# Patient Record
Sex: Female | Born: 1965 | Race: Black or African American | Hispanic: No | Marital: Single | Smoking: Current some day smoker
Health system: Southern US, Community
[De-identification: ages and names within clinical notes are randomized; demographics above are authoritative.]

## PROBLEM LIST (undated history)

## (undated) DIAGNOSIS — C801 Malignant (primary) neoplasm, unspecified: Secondary | ICD-10-CM

## (undated) DIAGNOSIS — E7800 Pure hypercholesterolemia, unspecified: Secondary | ICD-10-CM

## (undated) DIAGNOSIS — E079 Disorder of thyroid, unspecified: Secondary | ICD-10-CM

## (undated) DIAGNOSIS — I10 Essential (primary) hypertension: Secondary | ICD-10-CM

## (undated) DIAGNOSIS — E559 Vitamin D deficiency, unspecified: Secondary | ICD-10-CM

## (undated) DIAGNOSIS — M1990 Unspecified osteoarthritis, unspecified site: Secondary | ICD-10-CM

## (undated) DIAGNOSIS — F32A Depression, unspecified: Secondary | ICD-10-CM

## (undated) DIAGNOSIS — F329 Major depressive disorder, single episode, unspecified: Secondary | ICD-10-CM

## (undated) DIAGNOSIS — M5430 Sciatica, unspecified side: Secondary | ICD-10-CM

## (undated) HISTORY — PX: BREAST SURGERY: SHX581

## (undated) HISTORY — PX: MASTECTOMY: SHX3

## (undated) HISTORY — PX: GASTRIC BYPASS: SHX52

## (undated) HISTORY — PX: CHOLECYSTECTOMY: SHX55

## (undated) HISTORY — PX: ABDOMINAL HYSTERECTOMY: SHX81

---

## 2017-03-09 ENCOUNTER — Other Ambulatory Visit: Payer: Self-pay | Admitting: Internal Medicine

## 2017-03-09 DIAGNOSIS — R1900 Intra-abdominal and pelvic swelling, mass and lump, unspecified site: Secondary | ICD-10-CM

## 2017-05-13 ENCOUNTER — Emergency Department (HOSPITAL_COMMUNITY)
Admission: EM | Admit: 2017-05-13 | Discharge: 2017-05-13 | Disposition: A | Discharge status: Home / Self Care | Payer: Medicaid Other | Attending: Emergency Medicine | Admitting: Emergency Medicine

## 2017-05-13 ENCOUNTER — Encounter (HOSPITAL_COMMUNITY): Payer: Self-pay | Admitting: Emergency Medicine

## 2017-05-13 DIAGNOSIS — I10 Essential (primary) hypertension: Secondary | ICD-10-CM | POA: Insufficient documentation

## 2017-05-13 DIAGNOSIS — M5432 Sciatica, left side: Secondary | ICD-10-CM

## 2017-05-13 DIAGNOSIS — E079 Disorder of thyroid, unspecified: Secondary | ICD-10-CM | POA: Diagnosis not present

## 2017-05-13 DIAGNOSIS — F17210 Nicotine dependence, cigarettes, uncomplicated: Secondary | ICD-10-CM | POA: Diagnosis not present

## 2017-05-13 DIAGNOSIS — M545 Low back pain: Secondary | ICD-10-CM | POA: Diagnosis present

## 2017-05-13 HISTORY — DX: Disorder of thyroid, unspecified: E07.9

## 2017-05-13 HISTORY — DX: Unspecified osteoarthritis, unspecified site: M19.90

## 2017-05-13 HISTORY — DX: Vitamin D deficiency, unspecified: E55.9

## 2017-05-13 HISTORY — DX: Major depressive disorder, single episode, unspecified: F32.9

## 2017-05-13 HISTORY — DX: Malignant (primary) neoplasm, unspecified: C80.1

## 2017-05-13 HISTORY — DX: Sciatica, unspecified side: M54.30

## 2017-05-13 HISTORY — DX: Essential (primary) hypertension: I10

## 2017-05-13 HISTORY — DX: Depression, unspecified: F32.A

## 2017-05-13 HISTORY — DX: Pure hypercholesterolemia, unspecified: E78.00

## 2017-05-13 MED ORDER — PREDNISONE 20 MG PO TABS: 40.0000 mg | ORAL_TABLET | Freq: Every day | ORAL | 0 refills | Status: AC

## 2017-05-13 MED ORDER — DEXAMETHASONE SODIUM PHOSPHATE 10 MG/ML IJ SOLN
10.0000 mg | Freq: Once | INTRAMUSCULAR | Status: AC
  Administered 2017-05-13: 10 mg via INTRAMUSCULAR
  Filled 2017-05-13: qty 1

## 2017-05-13 MED ORDER — HYDROCODONE-ACETAMINOPHEN 5-325 MG PO TABS
2.0000 | ORAL_TABLET | Freq: Once | ORAL | Status: AC
  Administered 2017-05-13: 2 via ORAL
  Filled 2017-05-13: qty 2

## 2017-05-13 MED ORDER — CYCLOBENZAPRINE HCL 10 MG PO TABS: 10.0000 mg | ORAL_TABLET | Freq: Two times a day (BID) | ORAL | 0 refills | Status: DC | PRN

## 2017-05-13 NOTE — ED Triage Notes (Signed)
Pt c/o L lower back pain radiating down L thigh.

## 2017-05-13 NOTE — ED Provider Notes (Signed)
[facility] EMERGENCY DEPARTMENT Provider Note   CSN: [id number] Arrival date & time: 05/13/17  4431     History   Chief Complaint Chief Complaint  Patient presents with  . Back Pain    HPI Melanie Sharp is a 52 y.o. female.  Patient presents to the emergency department with a chief complaint of left-sided low back pain.  She states that the pain started a couple of days ago.  She reports that the pain comes and goes.  She states that it worsened this evening.  She complains of pain radiating down her left leg.  She denies any fevers, or chills.  She denies any bowel or bladder incontinence.  She has tried taking OTC meds with no relief.  She reports a history of sciatica, and states that this feels the same.   The history is provided by the patient. No language interpreter was used.    Past Medical History:  Diagnosis Date  . Arthritis   . Cancer ([place])   . Depression   . Hypercholesteremia   . Hypertension   . Sciatica   . Thyroid disease   . Vitamin D deficiency     There are no active problems to display for this patient.   Past Surgical History:  Procedure Laterality Date  . ABDOMINAL HYSTERECTOMY    . BREAST SURGERY Bilateral   . CHOLECYSTECTOMY    . GASTRIC BYPASS    . MASTECTOMY      OB History    No data available       Home Medications    Prior to Admission medications   Medication Sig Start Date End Date Taking? Authorizing Provider  cyclobenzaprine (FLEXERIL) 10 MG tablet Take 1 tablet (10 mg total) by mouth 2 (two) times daily as needed for muscle spasms. 05/13/17   Montine Circle, PA-C  predniSONE (DELTASONE) 20 MG tablet Take 2 tablets (40 mg total) by mouth daily. Take 40 mg by mouth daily for 3 days, then 20mg  by mouth daily for 3 days, then 10mg  daily for 3 days 05/13/17   Montine Circle, PA-C    Family History No family history on file.  Social History Social History   Tobacco Use  . Smoking status: Current Some  Day Smoker  . Smokeless tobacco: Former Network engineer Use Topics  . Alcohol use: Yes  . Drug use: No     Allergies   Patient has no known allergies.   Review of Systems Review of Systems  All other systems reviewed and are negative.    Physical Exam Updated Vital Signs BP (!) 143/105 (BP Location: Right Arm)   Pulse 72   Temp 98.1 F (36.7 C) (Oral)   Resp 18   Ht 5\' 8"  (1.727 m)   Wt 86.6 kg (191 lb)   SpO2 100%   BMI 29.04 kg/m   Physical Exam Physical Exam  Constitutional: Pt appears well-developed and well-nourished. No distress.  HENT:  Head: Normocephalic and atraumatic.  Mouth/Throat: Oropharynx is clear and moist. No oropharyngeal exudate.  Eyes: Conjunctivae are normal.  Neck: Normal range of motion. Neck supple.  No meningismus Cardiovascular: Normal rate, regular rhythm and intact distal pulses.   Pulmonary/Chest: Effort normal and breath sounds normal. No respiratory distress. Pt has no wheezes.  Abdominal: Pt exhibits no distension Musculoskeletal:  No CTLS spine tenderness, deformity, step-off, or crepitus Lymphadenopathy: Pt has no cervical adenopathy.  Neurological: Pt is alert and oriented Speech is clear and goal oriented,  follows commands Normal 5/5 strength in upper and lower extremities bilaterally including dorsiflexion and plantar flexion, strong and equal grip strength Sensation intact Great toe extension intact Moves extremities without ataxia, coordination intact Normal gait Normal balance No Clonus Skin: Skin is warm and dry. No rash noted. Pt is not diaphoretic. No erythema.  Psychiatric: Pt has a normal mood and affect. Behavior is normal.  Nursing note and vitals reviewed.   ED Treatments / Results  Labs (all labs ordered are listed, but only abnormal results are displayed) Labs Reviewed - No data to display  EKG  EKG Interpretation None       Radiology No results found.  Procedures Procedures (including  critical care time)  Medications Ordered in ED Medications  dexamethasone (DECADRON) injection 10 mg (10 mg Intramuscular Given 05/13/17 0614)  HYDROcodone-acetaminophen (NORCO/VICODIN) 5-325 MG per tablet 2 tablet (2 tablets Oral Given 05/13/17 9242)     Initial Impression / Assessment and Plan / ED Course  I have reviewed the triage vital signs and the nursing notes.  Pertinent labs & imaging results that were available during my care of the patient were reviewed by me and considered in my medical decision making (see chart for details).     Patient with sciatica x several days.    No neurological deficits and normal neuro exam.  Patient is ambulatory.  No loss of bowel or bladder control.  Doubt cauda equina.  Denies fever,  doubt epidural abscess or other lesion. Recommend back exercises, stretching, RICE, and will treat with a short course of flexeril and prednisone.  Encouraged the patient that there could be a need for additional workup and/or imaging such as MRI, if the symptoms do not resolve. Patient advised that if the back pain does not resolve, or radiates, this could progress to more serious conditions and is encouraged to follow-up with PCP or orthopedics within 2 weeks.     Final Clinical Impressions(s) / ED Diagnoses   Final diagnoses:  Sciatica of left side    ED Discharge Orders        Ordered    cyclobenzaprine (FLEXERIL) 10 MG tablet  2 times daily PRN     05/13/17 0627    predniSONE (DELTASONE) 20 MG tablet  Daily     05/13/17 0627       Montine Circle, PA-C 05/13/17 6834    Varney Biles, MD 05/14/17 [id number]

## 2017-05-23 ENCOUNTER — Telehealth: Payer: Self-pay | Admitting: Hematology and Oncology

## 2017-05-23 ENCOUNTER — Encounter: Payer: Self-pay | Admitting: Hematology and Oncology

## 2017-05-23 NOTE — Telephone Encounter (Signed)
Appt has been scheduled for the pt to see Dr. Lindi Adie on 3/19 at 345pm. Pt aware to arrive 30 minutes early. Letter mailed.

## 2017-06-11 ENCOUNTER — Ambulatory Visit
Admission: RE | Admit: 2017-06-11 | Discharge: 2017-06-11 | Disposition: A | Discharge status: Home / Self Care | Payer: Medicaid Other | Source: Ambulatory Visit | Attending: Internal Medicine | Admitting: Internal Medicine

## 2017-06-11 DIAGNOSIS — R1900 Intra-abdominal and pelvic swelling, mass and lump, unspecified site: Secondary | ICD-10-CM

## 2017-06-13 ENCOUNTER — Other Ambulatory Visit (HOSPITAL_BASED_OUTPATIENT_CLINIC_OR_DEPARTMENT_OTHER): Payer: Self-pay

## 2017-06-13 ENCOUNTER — Ambulatory Visit
Admission: RE | Admit: 2017-06-13 | Discharge: 2017-06-13 | Disposition: A | Discharge status: Home / Self Care | Payer: Medicaid Other | Source: Ambulatory Visit | Attending: Internal Medicine | Admitting: Internal Medicine

## 2017-06-13 DIAGNOSIS — R0683 Snoring: Secondary | ICD-10-CM

## 2017-06-13 DIAGNOSIS — G4710 Hypersomnia, unspecified: Secondary | ICD-10-CM

## 2017-06-26 ENCOUNTER — Inpatient Hospital Stay: Payer: Medicaid Other | Attending: Hematology and Oncology | Admitting: Hematology and Oncology

## 2017-06-26 ENCOUNTER — Telehealth: Payer: Self-pay | Admitting: Hematology and Oncology

## 2017-06-26 DIAGNOSIS — Z853 Personal history of malignant neoplasm of breast: Secondary | ICD-10-CM | POA: Diagnosis not present

## 2017-06-26 DIAGNOSIS — R222 Localized swelling, mass and lump, trunk: Secondary | ICD-10-CM

## 2017-06-26 DIAGNOSIS — Z8589 Personal history of malignant neoplasm of other organs and systems: Secondary | ICD-10-CM | POA: Diagnosis not present

## 2017-06-26 DIAGNOSIS — Z170 Estrogen receptor positive status [ER+]: Secondary | ICD-10-CM | POA: Diagnosis not present

## 2017-06-26 DIAGNOSIS — C50211 Malignant neoplasm of upper-inner quadrant of right female breast: Secondary | ICD-10-CM | POA: Diagnosis not present

## 2017-06-26 DIAGNOSIS — Z720 Tobacco use: Secondary | ICD-10-CM | POA: Diagnosis not present

## 2017-06-26 NOTE — Telephone Encounter (Signed)
Patient declined AVS and calendar of upcoming march 2020 appointments.

## 2017-06-26 NOTE — Assessment & Plan Note (Signed)
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

## 2017-06-26 NOTE — Progress Notes (Signed)
[facility] CONSULT NOTE  Patient Care Team: Audley Hose, MD as PCP - General (Internal Medicine)  CHIEF COMPLAINTS/PURPOSE OF CONSULTATION:  Follow-up of previously diagnosed breast cancer to establish oncology care with [place]  HISTORY OF PRESENTING ILLNESS:  Melanie Sharp 52 y.o. female is here because of a prior diagnosis of left breast cancer.  She was diagnosed with a left breast cancer in 2010.  She had lobular breast cancer that was treated with lumpectomy followed by adjuvant chemotherapy.  She refused antiestrogen therapy.  Subsequently in 2015 she developed left breast relapse and after which she underwent bilateral mastectomies.  In 2015 she was placed on letrozole and she has been on it for the past 4 years.  She reports a direct letrozole is being well tolerated.  She has occasional hot flashes and muscle aches and pains.  These are not unbearable. It is unclear as to why she moved to [place].  She tells me that her friend moved to [place] and states she followed her.  After she followed her her friend moved away to [employer].  She does be she has 6 children and almost 20 grandchildren.  I reviewed her records extensively and collaborated the history with the patient.  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 Arthritis   . Cancer  ([place])   . Depression   . Hypercholesteremia   . Hypertension   . Sciatica   . Thyroid disease   . Vitamin D deficiency     SURGICAL HISTORY: Past Surgical History:  Procedure Laterality Date  . ABDOMINAL HYSTERECTOMY    . BREAST SURGERY Bilateral   . CHOLECYSTECTOMY    . GASTRIC BYPASS    . MASTECTOMY      SOCIAL HISTORY: Social History   Socioeconomic History  . Marital status: Single    Spouse name: Not on file  . Number of children: Not on file  . Years of education: Not on file  . Highest education level: Not on file  Social Needs  . Financial resource strain: Not on file  . Food insecurity - worry: Not on file  . Food insecurity - inability: Not on file  . Transportation needs - medical: Not on file  . Transportation needs - non-medical: Not on file  Occupational History  . Not on file  Tobacco Use  . Smoking status: Current Some Day Smoker  . Smokeless tobacco: Former Network engineer and Sexual Activity  . Alcohol use: Yes  . Drug use: No  . Sexual activity: Not on file  Other Topics Concern  . Not on file  Social History Narrative  . Not on file    FAMILY HISTORY: No family history on file.  ALLERGIES:  has No Known Allergies.  MEDICATIONS:  Current Outpatient Medications  Medication Sig Dispense Refill  .  cyclobenzaprine (FLEXERIL) 10 MG tablet Take 1 tablet (10 mg total) by mouth 2 (two) times daily as needed for muscle spasms. 20 tablet 0  . predniSONE (DELTASONE) 20 MG tablet Take 2 tablets (40 mg total) by mouth daily. Take 40 mg by mouth daily for 3 days, then 20mg  by mouth daily for 3 days, then 10mg  daily for 3 days 12 tablet 0   No current facility-administered medications for this visit.     REVIEW OF SYSTEMS:   Constitutional: Denies fevers, chills or abnormal night sweats Eyes: Denies blurriness of vision, double vision or watery eyes Ears, nose, mouth, throat, and face: Denies mucositis or sore throat Respiratory: Denies cough,  dyspnea or wheezes Cardiovascular: Denies palpitation, chest discomfort or lower extremity swelling Gastrointestinal:  Denies nausea, heartburn or change in bowel habits Skin: Denies abnormal skin rashes Lymphatics: Denies new lymphadenopathy or easy bruising Neurological:Denies numbness, tingling or new weaknesses Behavioral/Psych: Mood is stable, no new changes  Breast: Bilateral mastectomies with a palpable nodule on the left chest wall related to scar tissue All other systems were reviewed with the patient and are negative.  PHYSICAL EXAMINATION: ECOG PERFORMANCE STATUS: 1 - Symptomatic but completely ambulatory  Vitals:   06/26/17 1538  BP: (!) 143/110  Pulse: 74  Resp: 20  Temp: 98.3 F (36.8 C)  SpO2: 99%   Filed Weights   06/26/17 1538  Weight: 198 lb 4.8 oz (89.9 kg)    GENERAL:alert, no distress and comfortable SKIN: skin color, texture, turgor are normal, no rashes or significant lesions EYES: normal, conjunctiva are pink and non-injected, sclera clear OROPHARYNX:no exudate, no erythema and lips, buccal mucosa, and tongue normal  NECK: supple, thyroid normal size, non-tender, without nodularity LYMPH:  no palpable lymphadenopathy in the cervical, axillary or inguinal LUNGS: clear to auscultation and percussion with normal breathing effort HEART: regular rate & rhythm and no murmurs and no lower extremity edema ABDOMEN:abdomen soft, non-tender and normal bowel sounds Musculoskeletal:no cyanosis of digits and no clubbing  PSYCH: alert & oriented x 3 with fluent speech NEURO: no focal motor/sensory deficits  RADIOGRAPHIC STUDIES: I have personally reviewed the radiological reports and agreed with the findings in the report.  ASSESSMENT AND PLAN:  Malignant neoplasm of upper-inner quadrant of right breast, estrogen receptor positive ([place]) T2N1 lobular carcinoma of the breast (surgery->adjuvant TC (completed 06/03/08); declined hormonal manipulation on multiple  occasions Recurrence: 2015 : status post bilateral mastectomies followed by letrozole 2.5 mg daily Chest wall masses (benign on prior imaging): Suture granulomas  Current treatment: Letrozole 2.5 mg daily since 2015 Recommendation: Treat the patient for 5-7 years  Return to clinic in 1 year for follow-up for surveillance   All questions were answered. The patient knows to call the clinic with any problems, questions or concerns.    Harriette Ohara, MD 06/26/17

## 2017-06-27 ENCOUNTER — Ambulatory Visit (HOSPITAL_BASED_OUTPATIENT_CLINIC_OR_DEPARTMENT_OTHER): Payer: Medicaid Other | Attending: Internal Medicine | Admitting: Internal Medicine

## 2017-06-27 DIAGNOSIS — R400 Somnolence: Secondary | ICD-10-CM | POA: Diagnosis present

## 2017-06-27 DIAGNOSIS — G4733 Obstructive sleep apnea (adult) (pediatric): Secondary | ICD-10-CM | POA: Insufficient documentation

## 2017-06-27 DIAGNOSIS — G4710 Hypersomnia, unspecified: Secondary | ICD-10-CM

## 2017-06-27 DIAGNOSIS — G4736 Sleep related hypoventilation in conditions classified elsewhere: Secondary | ICD-10-CM | POA: Diagnosis not present

## 2017-06-27 DIAGNOSIS — R0683 Snoring: Secondary | ICD-10-CM | POA: Diagnosis present

## 2017-06-30 DIAGNOSIS — G4733 Obstructive sleep apnea (adult) (pediatric): Secondary | ICD-10-CM | POA: Diagnosis not present

## 2017-06-30 NOTE — Procedures (Signed)
   Patient Name: Melanie Sharp, Melanie Sharp Date: 06/27/2017 Gender: Female D.O.B: [birth year] Age (years): 69 Referring Provider: Latanya Presser MD Height (inches): 68 Interpreting Physician: Baird Lyons MD, ABSM Weight (lbs): 198 RPSGT: [place]er, Holly BMI: 30 MRN: [id number] Neck Size: 14.00 <br> <br> CLINICAL INFORMATION Sleep Study Type: HST Indication for sleep study: Excessive Daytime Sleepiness, Snoring  Epworth Sleepiness Score: 7  SLEEP STUDY TECHNIQUE A multi-channel overnight portable sleep study was performed. The channels recorded were: nasal airflow, thoracic respiratory movement, and oxygen saturation with a pulse oximetry. Snoring was also monitored.  MEDICATIONS Patient self administered medications include: none reported.  SLEEP ARCHITECTURE Patient was studied for 526.9 minutes. The sleep efficiency was 97.6 % and the patient was supine for 19.8%. The arousal index was 0.0 per hour.  RESPIRATORY PARAMETERS The overall AHI was 5.8 per hour, with a central apnea index of 0.0 per hour.  The oxygen nadir was 67% during sleep.  CARDIAC DATA Mean heart rate during sleep was 63.6 bpm.  IMPRESSIONS - Mild obstructive sleep apnea occurred during this study (AHI = 5.8/h). - No significant central sleep apnea occurred during this study (CAI = 0.0/h). - Oxygen desaturation was noted during this study (Min O2 = 67%, Mean 95%). Total time - Patient snored.  DIAGNOSIS - Obstructive Sleep Apnea (327.23 [G47.33 ICD-10]) - Nocturnal Hypoxemia (327.26 [G47.36 ICD-10])  RECOMMENDATIONS - Conservative management of minimal OSA may be sufficient, emphasizing normal weight, sleep off back. Other options, and consideration of supplemental O2, would be based on clinical judgment. - Be careful with alcohol, sedatives and other CNS depressants that may worsen sleep apnea and disrupt normal sleep architecture. - Sleep hygiene should be reviewed to assess factors that may  improve sleep quality. - Weight management and regular exercise should be initiated or continued.  [Electronically signed] 06/30/2017 10:44 AM  Baird Lyons MD, ABSM Diplomate, American Board of Sleep Medicine   NPI: [id number]                          [place] [place] [facility]  ELECTRONICALLY SIGNED ON:  06/30/2017, 10:39 AM [place] PH: [phone]   FX: [phone] [place]

## 2017-08-08 ENCOUNTER — Encounter (HOSPITAL_BASED_OUTPATIENT_CLINIC_OR_DEPARTMENT_OTHER): Payer: Self-pay

## 2017-08-08 DIAGNOSIS — G4733 Obstructive sleep apnea (adult) (pediatric): Secondary | ICD-10-CM

## 2017-08-27 ENCOUNTER — Emergency Department (HOSPITAL_COMMUNITY)
Admission: EM | Admit: 2017-08-27 | Discharge: 2017-08-27 | Disposition: A | Discharge status: Home / Self Care | Payer: Medicaid Other | Attending: Emergency Medicine | Admitting: Emergency Medicine

## 2017-08-27 ENCOUNTER — Encounter (HOSPITAL_COMMUNITY): Payer: Self-pay | Admitting: *Deleted

## 2017-08-27 ENCOUNTER — Other Ambulatory Visit: Payer: Self-pay

## 2017-08-27 DIAGNOSIS — I10 Essential (primary) hypertension: Secondary | ICD-10-CM | POA: Diagnosis not present

## 2017-08-27 DIAGNOSIS — M79661 Pain in right lower leg: Secondary | ICD-10-CM

## 2017-08-27 DIAGNOSIS — Z720 Tobacco use: Secondary | ICD-10-CM | POA: Diagnosis not present

## 2017-08-27 DIAGNOSIS — M79605 Pain in left leg: Secondary | ICD-10-CM | POA: Diagnosis present

## 2017-08-27 DIAGNOSIS — M79604 Pain in right leg: Secondary | ICD-10-CM | POA: Insufficient documentation

## 2017-08-27 DIAGNOSIS — M79662 Pain in left lower leg: Secondary | ICD-10-CM

## 2017-08-27 MED ORDER — CYCLOBENZAPRINE HCL 10 MG PO TABS: 10.0000 mg | ORAL_TABLET | Freq: Two times a day (BID) | ORAL | 0 refills | Status: AC | PRN

## 2017-08-27 NOTE — Discharge Instructions (Signed)
You were offered blood work and imaging today.  You were unavailable to stay for these tests.  Please follow with your primary care provider on Wednesday for further evaluation of your symptoms.  Please take medications as prescribed. If you develop worsening or new concerning symptoms you can return to the emergency department for re-evaluation.

## 2017-08-27 NOTE — ED Triage Notes (Signed)
Pt reports bilateral lower leg pain up to her knees. Pain is more severe when at rest. Has been to pcp and diagnosed with restless leg syndrome but reports no relief with requip. No swelling to legs.

## 2017-08-27 NOTE — ED Provider Notes (Signed)
[facility] EMERGENCY DEPARTMENT Provider Note   CSN: [id number] Arrival date & time: 08/27/17  1029     History   Chief Complaint Chief Complaint  Patient presents with  . Leg Pain    HPI Melanie Sharp is a 52 y.o. female with a history of breast cancer in remission, arthritis, hypertension, hyperlipidemia presents emergency department today for bilateral lower leg pain.  Patient states that she was diagnosed with restless leg syndrome by her PCP approximately 6 months ago.  She has been taking ropinirole 3 times daily for symptoms with relief.  She notes that over the last 1 week however the medication is no longer working and she feels after lying down she gets a dull, achy pain in her bilateral lower legs below the level of the knee.  She states that after walking around her symptoms do improve.  She has not been taking any other medications for her symptoms.  She denies any fever, chills, joint swelling, trauma, ivdu, skin changes or any other symptoms.  No low back pain, bowel/bladder incontinence, urinary retention, numbness/tingling/weakness of the lower extremities.  HPI  Past Medical History:  Diagnosis Date  . Arthritis   . Cancer ([place])   . Depression   . Hypercholesteremia   . Hypertension   . Sciatica   . Thyroid disease   . Vitamin D deficiency     Patient Active Problem List   Diagnosis Date Noted  . Malignant neoplasm of upper-inner quadrant of right breast, estrogen receptor positive ([place]) 06/26/2017    Past Surgical History:  Procedure Laterality Date  . ABDOMINAL HYSTERECTOMY    . BREAST SURGERY Bilateral   . CHOLECYSTECTOMY    . GASTRIC BYPASS    . MASTECTOMY       OB History   None      Home Medications    Prior to Admission medications   Medication Sig Start Date End Date Taking? Authorizing Provider  cyclobenzaprine (FLEXERIL) 10 MG tablet Take 1 tablet (10 mg total) by mouth 2 (two) times daily as needed for muscle  spasms. 05/13/17   Montine Circle, PA-C  predniSONE (DELTASONE) 20 MG tablet Take 2 tablets (40 mg total) by mouth daily. Take 40 mg by mouth daily for 3 days, then 20mg  by mouth daily for 3 days, then 10mg  daily for 3 days 05/13/17   Montine Circle, PA-C    Family History History reviewed. No pertinent family history.  Social History Social History   Tobacco Use  . Smoking status: Current Some Day Smoker  . Smokeless tobacco: Former Network engineer Use Topics  . Alcohol use: Yes  . Drug use: No     Allergies   Patient has no known allergies.   Review of Systems Review of Systems  All other systems reviewed and are negative.    Physical Exam Updated Vital Signs BP (!) 130/96 (BP Location: Right Arm)   Pulse 67   Temp 98.6 F (37 C) (Oral)   Resp 18   SpO2 100%   Physical Exam  Constitutional: She appears well-developed and well-nourished.  HENT:  Head: Normocephalic and atraumatic.  Right Ear: External ear normal.  Left Ear: External ear normal.  Eyes: Conjunctivae are normal. Right eye exhibits no discharge. Left eye exhibits no discharge. No scleral icterus.  Cardiovascular:  Pulses:      Dorsalis pedis pulses are 2+ on the right side, and 2+ on the left side.       Posterior  tibial pulses are 2+ on the right side, and 2+ on the left side.  No lower extremity edema. Calves symmetric in size b/l.  Compartments soft  Pulmonary/Chest: Effort normal. No respiratory distress.  Musculoskeletal:       Right hip: Normal.       Left hip: Normal.       Right knee: Normal.       Left knee: Normal.       Right ankle: Normal. Achilles tendon normal.       Left ankle: Normal. Achilles tendon normal.       Lumbar back: Normal.       Right upper leg: Normal.       Left upper leg: Normal.       Right lower leg: Normal.       Left lower leg: Normal.       Right foot: Normal.       Left foot: Normal.  Neurological: She is alert. She has normal strength. No sensory  deficit.  Normal gait.   Skin: Skin is warm, dry and intact. Capillary refill takes less than 2 seconds. No petechiae and no rash noted. No erythema. No pallor.  Psychiatric: She has a normal mood and affect.  Nursing note and vitals reviewed.    ED Treatments / Results  Labs (all labs ordered are listed, but only abnormal results are displayed) Labs Reviewed - No data to display  EKG None  Radiology No results found.  Procedures Procedures (including critical care time)  Medications Ordered in ED Medications - No data to display   Initial Impression / Assessment and Plan / ED Course  I have reviewed the triage vital signs and the nursing notes.  Pertinent labs & imaging results that were available during my care of the patient were reviewed by me and considered in my medical decision making (see chart for details).     52 year old female with history of breast cancer in remission who presents the emergency department today for bilateral lower leg pain.  Patient states that she has a previous history of restless leg syndrome and was prescribed ropinirole that has previously been helping with her symptoms.  Patient notes that over the last 1 week her medication is no longer working as it previously was.  She has not tried anything else for her symptoms.  She notes that it keeps her up at night.  Patient is with normal range of motion of the lower extremities.  No edema of the lower extremities.  She is neurovascular intact.  Doubt bilateral DVTs.  Patient without evidence of septic joint or cauda equina.  I did recommend obtaining x-rays to make sure there is no bony metastasis as well as obtaining a Chem-8 to check electrolyte levels however patient states that she is not able to stay and has a follow-up appoint with her primary care doctor on Wednesday.  I discussed the risks of not following up and not receiving evaluation today.  She states understanding. Will rx flexeril. I  advised the patient to follow-up with PCP on Wednesday. Specific return precautions discussed. Time was given for all questions to be answered. The patient verbalized understanding and agreement with plan. The patient appears safe for discharge home.  Final Clinical Impressions(s) / ED Diagnoses   Final diagnoses:  Pain in both lower legs    ED Discharge Orders        Ordered    cyclobenzaprine (FLEXERIL) 10 MG tablet  2 times daily PRN     08/27/17 1342       Lorelle Gibbs 08/27/17 1343    Margette Fast, MD 08/27/17 [id number]

## 2017-08-28 ENCOUNTER — Emergency Department (HOSPITAL_COMMUNITY)
Admission: EM | Admit: 2017-08-28 | Discharge: 2017-08-28 | Disposition: A | Discharge status: Home / Self Care | Payer: Medicaid Other | Attending: Emergency Medicine | Admitting: Emergency Medicine

## 2017-08-28 ENCOUNTER — Encounter (HOSPITAL_COMMUNITY): Payer: Self-pay | Admitting: *Deleted

## 2017-08-28 ENCOUNTER — Other Ambulatory Visit: Payer: Self-pay

## 2017-08-28 ENCOUNTER — Emergency Department (HOSPITAL_COMMUNITY): Admit: 2017-08-28 | Payer: Medicaid Other

## 2017-08-28 DIAGNOSIS — M79604 Pain in right leg: Secondary | ICD-10-CM | POA: Insufficient documentation

## 2017-08-28 DIAGNOSIS — Z5321 Procedure and treatment not carried out due to patient leaving prior to being seen by health care provider: Secondary | ICD-10-CM | POA: Diagnosis not present

## 2017-08-28 DIAGNOSIS — M79605 Pain in left leg: Secondary | ICD-10-CM | POA: Insufficient documentation

## 2017-08-28 LAB — COMPREHENSIVE METABOLIC PANEL
ALBUMIN: 3.7 g/dL (ref 3.5–5.0)
ALK PHOS: 80 U/L (ref 38–126)
ALT: 14 U/L (ref 14–54)
AST: 17 U/L (ref 15–41)
Anion gap: 9 (ref 5–15)
BILIRUBIN TOTAL: 0.7 mg/dL (ref 0.3–1.2)
BUN: 7 mg/dL (ref 6–20)
CO2: 27 mmol/L (ref 22–32)
CREATININE: 0.74 mg/dL (ref 0.44–1.00)
Calcium: 9.9 mg/dL (ref 8.9–10.3)
Chloride: 104 mmol/L (ref 101–111)
GFR calc Af Amer: >60 mL/min (ref >60)
GLUCOSE: 106 mg/dL — AB (ref 65–99)
POTASSIUM: 3.8 mmol/L (ref 3.5–5.1)
Sodium: 140 mmol/L (ref 135–145)
TOTAL PROTEIN: 6.4 g/dL — AB (ref 6.5–8.1)

## 2017-08-28 NOTE — ED Triage Notes (Signed)
Pt is here to have ultrasound on her legs today and was told to come back here yesterday by the EDP.  Pt has been having pain in both legs.

## 2017-09-04 ENCOUNTER — Other Ambulatory Visit (HOSPITAL_COMMUNITY): Payer: Self-pay | Admitting: Student

## 2017-09-04 ENCOUNTER — Ambulatory Visit (HOSPITAL_COMMUNITY)
Admission: RE | Admit: 2017-09-04 | Discharge: 2017-09-04 | Disposition: A | Discharge status: Home / Self Care | Payer: Medicaid Other | Source: Ambulatory Visit | Attending: Student | Admitting: Student

## 2017-09-04 DIAGNOSIS — R609 Edema, unspecified: Secondary | ICD-10-CM | POA: Diagnosis not present

## 2017-09-04 NOTE — Progress Notes (Signed)
Preliminary results by tech - Venous Duplex Lower Ext. Completed. Negative for deep and superficial vein thrombosis in both legs.  Kaydon Husby, BS, RDMS, RVT  

## 2017-09-06 ENCOUNTER — Other Ambulatory Visit: Payer: Self-pay | Admitting: Internal Medicine

## 2017-09-06 DIAGNOSIS — I872 Venous insufficiency (chronic) (peripheral): Secondary | ICD-10-CM

## 2017-09-14 ENCOUNTER — Ambulatory Visit (HOSPITAL_BASED_OUTPATIENT_CLINIC_OR_DEPARTMENT_OTHER): Payer: Medicaid Other | Attending: Internal Medicine | Admitting: Internal Medicine

## 2017-09-14 VITALS — Ht 68.0 in | Wt 196.0 lb

## 2017-09-14 DIAGNOSIS — G4733 Obstructive sleep apnea (adult) (pediatric): Secondary | ICD-10-CM | POA: Insufficient documentation

## 2017-09-26 ENCOUNTER — Other Ambulatory Visit: Payer: Self-pay | Admitting: Internal Medicine

## 2017-09-26 DIAGNOSIS — I872 Venous insufficiency (chronic) (peripheral): Secondary | ICD-10-CM

## 2017-09-27 DIAGNOSIS — G4733 Obstructive sleep apnea (adult) (pediatric): Secondary | ICD-10-CM | POA: Diagnosis not present

## 2017-09-27 NOTE — Procedures (Signed)
Patient Name: Melanie Sharp, Melanie Sharp Date: 09/14/2017 Gender: Female D.O.B: 1965/06/12 Age (years): 35 Referring Provider: Latanya Presser MD Height (inches): 68 Interpreting Physician: Baird Lyons MD, ABSM Weight (lbs): 198 RPSGT: Lanae Boast BMI: 30 MRN: [id number] Neck Size: 13.00  CLINICAL INFORMATION The patient is referred for a CPAP titration to treat sleep apnea. Date of NPSG, Split Night or HST:  NPSG 06/27/17  AHI 5.8/ hr, desaturation to 67%, body weight 198 lbs  SLEEP STUDY TECHNIQUE As per the AASM Manual for the Scoring of Sleep and Associated Events v2.3 (April 2016) with a hypopnea requiring 4% desaturations.  The channels recorded and monitored were frontal, central and occipital EEG, electrooculogram (EOG), submentalis EMG (chin), nasal and oral airflow, thoracic and abdominal wall motion, anterior tibialis EMG, snore microphone, electrocardiogram, and pulse oximetry. Continuous positive airway pressure (CPAP) was initiated at the beginning of the study and titrated to treat sleep-disordered breathing.  MEDICATIONS Medications self-administered by patient taken the night of the study : none reported  TECHNICIAN COMMENTS Comments added by technician: Patient tolerated CPAP well Comments added by scorer: N/A  RESPIRATORY PARAMETERS Optimal PAP Pressure (cm): 10 AHI at Optimal Pressure (/hr): 0.0 Overall Minimal O2 (%): 93.0 Supine % at Optimal Pressure (%): 100 Minimal O2 at Optimal Pressure (%): 96.0   SLEEP ARCHITECTURE The study was initiated at 10:00:50 PM and ended at 4:41:28 AM.  Sleep onset time was 2.6 minutes and the sleep efficiency was 94.2%%. The total sleep time was 377.5 minutes.  The patient spent 8.6%% of the night in stage N1 sleep, 73.9%% in stage N2 sleep, 6.4%% in stage N3 and 11.13% in REM.Stage REM latency was 259.0 minutes  Wake after sleep onset was 20.5. Alpha intrusion was absent. Supine sleep was 24.90%.  CARDIAC  DATA The 2 lead EKG demonstrated sinus rhythm. The mean heart rate was 73.2 beats per minute. Other EKG findings include: None.  LEG MOVEMENT DATA The total Periodic Limb Movements of Sleep (PLMS) were 0. The PLMS index was 0.0. A PLMS index of <15 is considered normal in adults.  IMPRESSIONS - The optimal PAP pressure was 10 cm of water. - Central sleep apnea was not noted during this titration (CAI = 0.2/h). - Significant oxygen desaturations were not observed during this titration (min O2 = 93.0%). - The patient snored with soft snoring volume during this titration study. - No cardiac abnormalities were observed during this study. - Clinically significant periodic limb movements were not noted during this study. Arousals associated with PLMs were rare.  DIAGNOSIS - Obstructive Sleep Apnea (327.23 [G47.33 ICD-10])  RECOMMENDATIONS - Trial of CPAP therapy on 10 cm H2O or autopap 5-15. Patient used a Small size Fisher&Paykel Nasal Pillow Mask Brevida mask and heated humidification. - Be careful with alcohol, sedatives and other CNS depressants that may worsen sleep apnea and disrupt normal sleep architecture. - Sleep hygiene should be reviewed to assess factors that may improve sleep quality. - Weight management and regular exercise should be initiated or continued.  [Electronically signed] 09/27/2017 12:57 PM  Baird Lyons MD, [place], American Board of Sleep Medicine   NPI: [id number]                         [place], [facility]  ELECTRONICALLY SIGNED ON:  09/27/2017, 12:54 PM Lyons PH: [phone]   FX: [phone] [place]

## 2017-10-03 ENCOUNTER — Other Ambulatory Visit: Payer: Medicaid Other

## 2018-06-27 ENCOUNTER — Inpatient Hospital Stay: Payer: Medicaid Other | Attending: Hematology and Oncology | Admitting: Hematology and Oncology

## 2018-06-27 NOTE — Assessment & Plan Note (Deleted)
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  Intermittent arthralgias and myalgias 2.  Occasional hot flashes  Breast cancer surveillance: 1.  Examination: No palpable lumps or nodules 2.  No role of imaging studies because she had bilateral mastectomies.  Return to clinic in 1 year for follow-up for surveillance

## 2019-05-03 ENCOUNTER — Emergency Department (HOSPITAL_COMMUNITY)
Admission: EM | Admit: 2019-05-03 | Discharge: 2019-05-03 | Disposition: A | Discharge status: Home / Self Care | Payer: Medicaid Other | Attending: Emergency Medicine | Admitting: Emergency Medicine

## 2019-05-03 ENCOUNTER — Other Ambulatory Visit: Payer: Self-pay

## 2019-05-03 ENCOUNTER — Encounter (HOSPITAL_COMMUNITY): Payer: Self-pay | Admitting: *Deleted

## 2019-05-03 DIAGNOSIS — E876 Hypokalemia: Secondary | ICD-10-CM | POA: Diagnosis not present

## 2019-05-03 DIAGNOSIS — Z79899 Other long term (current) drug therapy: Secondary | ICD-10-CM | POA: Insufficient documentation

## 2019-05-03 DIAGNOSIS — Z853 Personal history of malignant neoplasm of breast: Secondary | ICD-10-CM | POA: Diagnosis not present

## 2019-05-03 DIAGNOSIS — I10 Essential (primary) hypertension: Secondary | ICD-10-CM | POA: Diagnosis not present

## 2019-05-03 DIAGNOSIS — N390 Urinary tract infection, site not specified: Secondary | ICD-10-CM

## 2019-05-03 DIAGNOSIS — M545 Low back pain, unspecified: Secondary | ICD-10-CM

## 2019-05-03 DIAGNOSIS — R109 Unspecified abdominal pain: Secondary | ICD-10-CM | POA: Diagnosis present

## 2019-05-03 DIAGNOSIS — R1030 Lower abdominal pain, unspecified: Secondary | ICD-10-CM | POA: Insufficient documentation

## 2019-05-03 DIAGNOSIS — F17210 Nicotine dependence, cigarettes, uncomplicated: Secondary | ICD-10-CM | POA: Diagnosis not present

## 2019-05-03 LAB — COMPREHENSIVE METABOLIC PANEL
ALT: 16 U/L (ref 0–44)
AST: 16 U/L (ref 15–41)
Albumin: 3.7 g/dL (ref 3.5–5.0)
Alkaline Phosphatase: 130 U/L — ABNORMAL HIGH (ref 38–126)
Anion gap: 9 (ref 5–15)
BUN: 6 mg/dL (ref 6–20)
CO2: 30 mmol/L (ref 22–32)
Calcium: 9.6 mg/dL (ref 8.9–10.3)
Chloride: 99 mmol/L (ref 98–111)
Creatinine, Ser: 0.83 mg/dL (ref 0.44–1.00)
GFR calc Af Amer: >60 mL/min (ref >60)
GFR calc non Af Amer: >60 mL/min (ref >60)
Glucose, Bld: 140 mg/dL — ABNORMAL HIGH (ref 70–99)
Potassium: 3.2 mmol/L — ABNORMAL LOW (ref 3.5–5.1)
Sodium: 138 mmol/L (ref 135–145)
Total Bilirubin: 0.6 mg/dL (ref 0.3–1.2)
Total Protein: 6.6 g/dL (ref 6.5–8.1)

## 2019-05-03 LAB — CBC
HCT: 40.2 % (ref 36.0–46.0)
Hemoglobin: 13.4 g/dL (ref 12.0–15.0)
MCH: 28.5 pg (ref 26.0–34.0)
MCHC: 33.3 g/dL (ref 30.0–36.0)
MCV: 85.4 fL (ref 80.0–100.0)
Platelets: 388 10*3/uL (ref 150–400)
RBC: 4.71 MIL/uL (ref 3.87–5.11)
RDW: 11.6 % (ref 11.5–15.5)
WBC: 6.1 10*3/uL (ref 4.0–10.5)
nRBC: 0 % (ref 0.0–0.2)

## 2019-05-03 LAB — URINALYSIS, ROUTINE W REFLEX MICROSCOPIC
Bilirubin Urine: NEGATIVE
Glucose, UA: NEGATIVE mg/dL
Hgb urine dipstick: NEGATIVE
Ketones, ur: NEGATIVE mg/dL
Nitrite: NEGATIVE
Protein, ur: 30 mg/dL — AB
Specific Gravity, Urine: 1.018 (ref 1.005–1.030)
WBC, UA: >50 WBC/hpf — ABNORMAL HIGH (ref 0–5)
pH: 7 (ref 5.0–8.0)

## 2019-05-03 LAB — LIPASE, BLOOD: Lipase: 21 U/L (ref 11–51)

## 2019-05-03 MED ORDER — POTASSIUM CHLORIDE CRYS ER 20 MEQ PO TBCR
40.0000 meq | EXTENDED_RELEASE_TABLET | Freq: Once | ORAL | Status: AC
  Administered 2019-05-03: 17:00:00 40 meq via ORAL
  Filled 2019-05-03: qty 2

## 2019-05-03 MED ORDER — SODIUM CHLORIDE 0.9% FLUSH: 3.0000 mL | Freq: Once | INTRAVENOUS | Status: DC

## 2019-05-03 MED ORDER — POTASSIUM CHLORIDE ER 10 MEQ PO TBCR: 10.0000 meq | EXTENDED_RELEASE_TABLET | Freq: Every day | ORAL | 0 refills | Status: AC

## 2019-05-03 MED ORDER — CEPHALEXIN 250 MG PO CAPS
500.0000 mg | ORAL_CAPSULE | Freq: Once | ORAL | Status: AC
  Administered 2019-05-03: 500 mg via ORAL
  Filled 2019-05-03: qty 2

## 2019-05-03 MED ORDER — CEPHALEXIN 500 MG PO CAPS
500.0000 mg | ORAL_CAPSULE | Freq: Two times a day (BID) | ORAL | 0 refills | Status: AC
Start: 2019-05-03 — End: 2019-05-10

## 2019-05-03 NOTE — ED Notes (Signed)
Patient verbalizes understanding of discharge instructions . Opportunity for questions and answers were provided . Armband removed by staff ,Pt discharged from ED. W/C  offered at D/C  and Declined W/C at D/C and was escorted to lobby by RN.  

## 2019-05-03 NOTE — Discharge Instructions (Signed)
We are treating you for a UTI today. We have given you a dose of medication in the ED. Please pick up prescription and take the remainder as prescribed.  Your potassium was mildly low today as well. We have repleted it in the ED. Please take remainder of pills as prescribed. You will need to have your potassium level rechecked in 1-2 weeks by your PCP.   Return to the ED for any worsening symptoms including worsening back pain, worsening abdominal pain, inability to tolerate fluids, fevers > 100.4, no improvement with the antibiotics, or any other symptoms.

## 2019-05-03 NOTE — ED Triage Notes (Signed)
Pt c/o abd ND BACK PAIN SINCE YESTERDAY  NO TEMP  SOME LOWER BACK PAIN ALSO

## 2019-05-03 NOTE — ED Provider Notes (Signed)
[facility] DEPART[facility] Provider Note   CSN: [id number] Arrival date & time: 05/03/19  1516     History Chief Complaint  Patient presents with  . Abdominal Pain    Melanie Sharp is a 54 y.o. 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  She also complains of urinary frequency and dysuria.  She reports that she has also been having some bilateral lower back pain that started last night after walking up 4-5 stairs, no injury.  She reports that this feels similar to when she had a UTI a couple of years ago.  Patient cannot recall if she had back pain at that time.  She does currently have breast cancer and is not receiving any chemotherapy but is receiving Faslodex injections monthly. She is scheduled to have a CT scan done of chest, A/P, next week. Pt denies fever, chills, nausea, vomiting, diarrhea, constipation, vaginal discharge, pelvic pain, radiation to lower extremities, urinary retention, urinary or bowel incontinence, saddle anesthesia, or any other associated symptoms.   The history is provided by the patient and medical records.       Past Medical History:  Diagnosis Date  . Arthritis   . Cancer ([place])   . Depression   . Hypercholesteremia   . Hypertension   . Sciatica   . Thyroid disease   . Vitamin D deficiency     Patient Active Problem List   Diagnosis Date Noted  . Malignant neoplasm of upper-inner quadrant of right breast, estrogen receptor positive ([place]) 06/26/2017    Past Surgical History:  Procedure Laterality Date  . ABDOMINAL HYSTERECTOMY    . BREAST SURGERY Bilateral   . CHOLECYSTECTOMY    . GASTRIC BYPASS    . MASTECTOMY       OB History   No obstetric history on file.     No family history on file.  Social History   Tobacco  Use  . Smoking status: Current Some Day Smoker  . Smokeless tobacco: Former Network engineer Use Topics  . Alcohol use: Yes    Comment: occ  . Drug use: No    Home Medications Prior to Admission medications   Medication Sig Start Date End Date Taking? Authorizing Provider  cephALEXin (KEFLEX) 500 MG capsule Take 1 capsule (500 mg total) by mouth 2 (two) times daily for 7 days. 05/03/19 05/10/19  Eustaquio Maize, PA-C  cyclobenzaprine (FLEXERIL) 10 MG tablet Take 1 tablet (10 mg total) by mouth 2 (two) times daily as needed for muscle spasms. 08/27/17   Maczis, Barth Kirks, PA-C  potassium chloride (KLOR-CON) 10 MEQ tablet Take 1 tablet (10 mEq total) by mouth daily for 5 days. 05/03/19 05/08/19  Eustaquio Maize, PA-C  predniSONE (DELTASONE) 20 MG tablet Take 2 tablets (40 mg total) by mouth daily. Take 40 mg by mouth daily for 3 days, then 69m by mouth daily for 3 days, then 123mdaily for 3 days 05/13/17   BrMontine CirclePA-C    Allergies    Patient has no known allergies.  Review of Systems   Review of Systems  Constitutional: Negative for chills and fever.  Gastrointestinal: Positive for abdominal pain. Negative for constipation, diarrhea, nausea and vomiting.  Genitourinary: Positive for dysuria and frequency. Negative for decreased urine volume, difficulty urinating, flank pain, pelvic pain, vaginal bleeding and vaginal  discharge.  Musculoskeletal: Positive for back pain.  All other systems reviewed and are negative.   Physical Exam Updated Vital Signs BP 125/85 (BP Location: Right Arm)   Pulse 88   Temp 98.4 F (36.9 C) (Oral)   Resp 17   Ht 5' 8.5" (1.74 m)   Wt 88.9 kg   SpO2 98%   BMI 29.37 kg/m   Physical Exam Vitals and nursing note reviewed.  Constitutional:      Appearance: She is not ill-appearing or diaphoretic.  HENT:     Head: Normocephalic and atraumatic.  Eyes:     Conjunctiva/sclera: Conjunctivae normal.  Cardiovascular:     Rate and Rhythm: Normal  rate and regular rhythm.  Pulmonary:     Effort: Pulmonary effort is normal.     Breath sounds: Normal breath sounds.  Abdominal:     Palpations: Abdomen is soft.     Tenderness: There is abdominal tenderness in the suprapubic area. There is no right CVA tenderness, left CVA tenderness, guarding or rebound.  Musculoskeletal:     Cervical back: Neck supple.       Back:     Comments: No C, T, or L midline spinal tenderness. + tenderness to bilateral lumbar back with palpation. ROM intact. Strength 5/5 to BLEs. Sensation intact throughout. 2+ DP pulses.   Skin:    General: Skin is warm and dry.  Neurological:     Mental Status: She is alert.     ED Results / Procedures / Treatments   Labs (all labs ordered are listed, but only abnormal results are displayed) Labs Reviewed  COMPREHENSIVE METABOLIC PANEL - Abnormal; Notable for the following components:      Result Value   Potassium 3.2 (*)    Glucose, Bld 140 (*)    Alkaline Phosphatase 130 (*)    All other components within normal limits  URINALYSIS, ROUTINE W REFLEX MICROSCOPIC - Abnormal; Notable for the following components:   APPearance CLOUDY (*)    Protein, ur 30 (*)    Leukocytes,Ua LARGE (*)    WBC, UA >50 (*)    Bacteria, UA RARE (*)    All other components within normal limits  URINE CULTURE  LIPASE, BLOOD  CBC    EKG None  Radiology No results found.  Procedures Procedures (including critical care time)  Medications Ordered in ED Medications  sodium chloride flush (NS) 0.9 % injection 3 mL (has no administration in time range)  potassium chloride SA (KLOR-CON) CR tablet 40 mEq (has no administration in time range)  cephALEXin (KEFLEX) capsule 500 mg (has no administration in time range)    ED Course  I have reviewed the triage vital signs and the nursing notes.  Pertinent labs & imaging results that were available during my care of the patient were reviewed by me and considered in my medical  decision making (see chart for details).  54 year old female who presents to the ED today complaining of abdominal pain, urinary symptoms, lower back pain for the past day.  On arrival to the ED she is afebrile, nontachycardic and nontachypneic.  As I enter the room she is sitting comfortably in bed and appears to be in no acute distress.  On exam she has tenderness to her suprapubic area, no tenderness to the right lower quadrant or left lower quadrant, no peritoneal signs.  Doubt acute abdomen.  No CVA tenderness today's to suggest kidney etiology.  She does have bilateral lower back tenderness.  She has  a history of sciatica but states this feels different.  She has equal strength and sensation to her lower extremities.  There are no red flag symptoms concerning for cauda equina, spinal epidural abscess, AAA at this time.  She does have known right breast cancer with metastasis to the bone.  Most recent PET scan done in November 2020 with lytic lesions to the thoracic spine.  Offered patient imaging today given back pain but she states she is scheduled to have CT scans done next week and does not want them done currently.   Labwork was obtained prior to being seen. CBC without leukocytosis.  Hemoglobin is stable.   BMP with potassium of 3.2.  Will prescribe K-Dur outpatient, have advised the patient will need to get her potassium level rechecked in 1 to 2 weeks.  Alk phos is elevated today at 130, suspect this is likely due to the metastasis into the bone.  Patient has history of cholecystectomy years ago.  No concern for gallbladder today.  Lipase negative. Urinalysis cloudy in appearance, large leuks, greater than 50 white blood cells per high-power field, 6-10 red blood cells.   Had discussion with patient regarding the fact that she has some red blood cells and lower back pain that she could be having a presentation of kidney stones.  Again she is not interested in obtaining a CT scan at this time.   She reports that she is fearful of the findings and would like to wait until next week when she sees her oncologist.  Feel that this is appropriate.  I will treat as a UTI today.  Have sent urine for culture.  Patient advised that she can return to the ED at anytime for any worsening symptoms including worsening back pain, no improvement with antibiotics, fevers, any other associated symptoms.  Patient is in agreement and is understanding.  He is stable for discharge at this time.   This note was prepared using Dragon voice recognition software and may include unintentional dictation errors due to the inherent limitations of voice recognition software.      MDM Rules/Calculators/A&P                      Final Clinical Impression(s) / ED Diagnoses Final diagnoses:  Lower urinary tract infectious disease  Lower abdominal pain  Acute bilateral low back pain without sciatica  Hypokalemia    Rx / DC Orders ED Discharge Orders         Ordered    potassium chloride (KLOR-CON) 10 MEQ tablet  Daily     05/03/19 1630    cephALEXin (KEFLEX) 500 MG capsule  2 times daily     05/03/19 1630           Eustaquio Maize, PA-C 05/03/19 1631    Elnora Morrison, MD 05/03/19 1925

## 2019-05-05 LAB — URINE CULTURE: Culture: NO GROWTH

## 2019-05-12 IMAGING — US US ABDOMEN COMPLETE
1 series · 13 of 25 positions shown · non-contrast
Comparison: None in PACs

CLINICAL DATA: Chronic abdominal distension. History of gastric
bypass in 4174, previous cholecystectomy, history of breast
malignancy

EXAM:
ABDOMEN ULTRASOUND COMPLETE

[Series 1: us abdomen complete · 0.23mm/px · 13 of 77 slices shown]
[im 1/77]
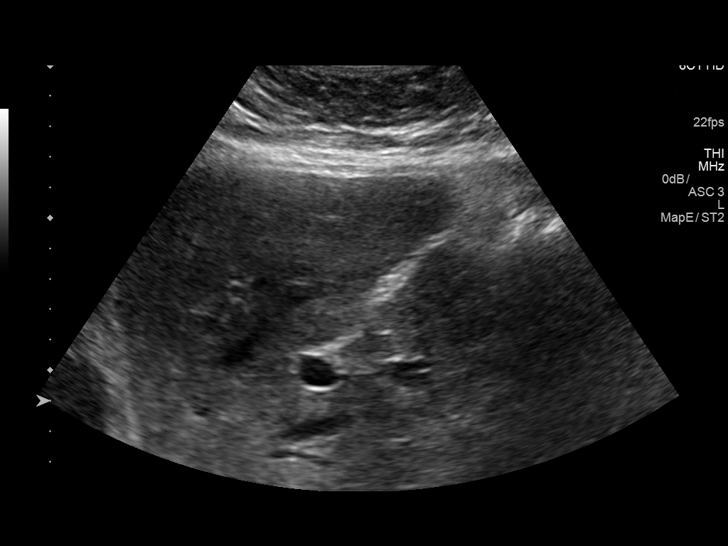
[im 7/77]
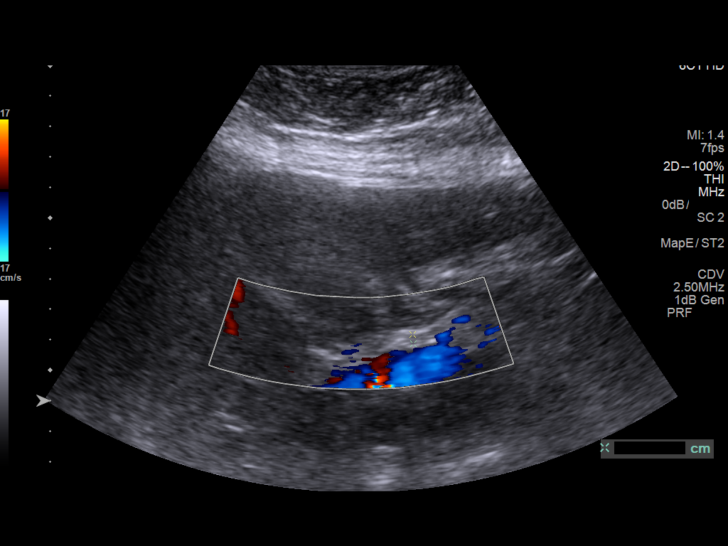
[im 13/77]
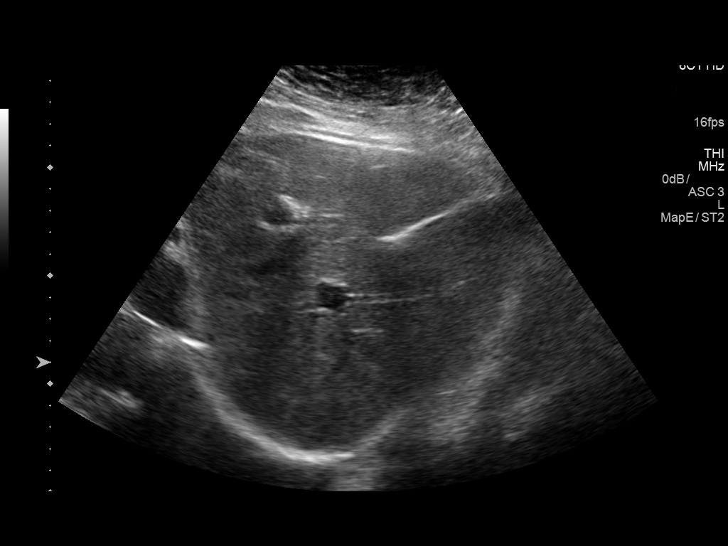
[im 20/77]
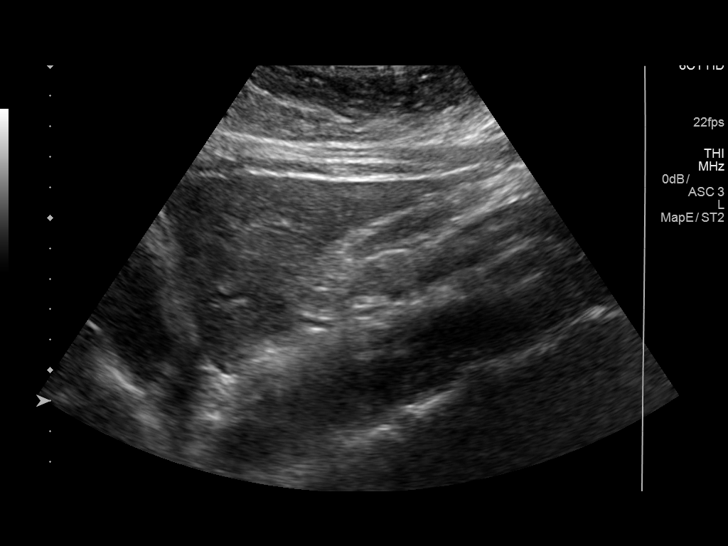
[im 26/77]
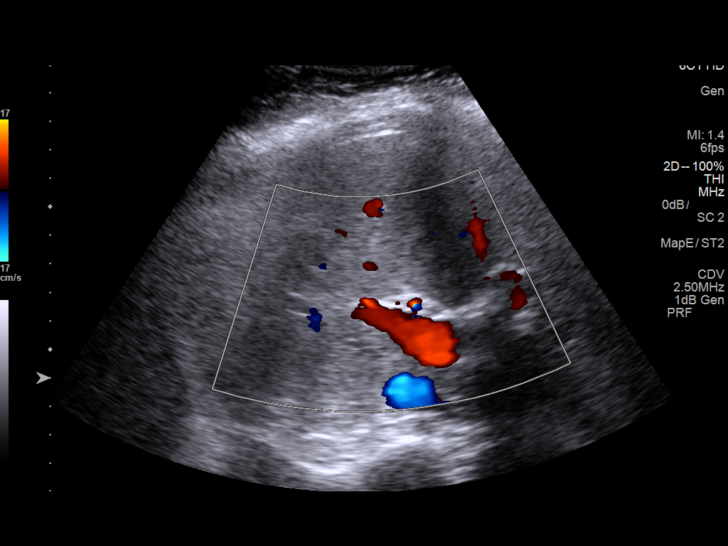
[im 32/77]
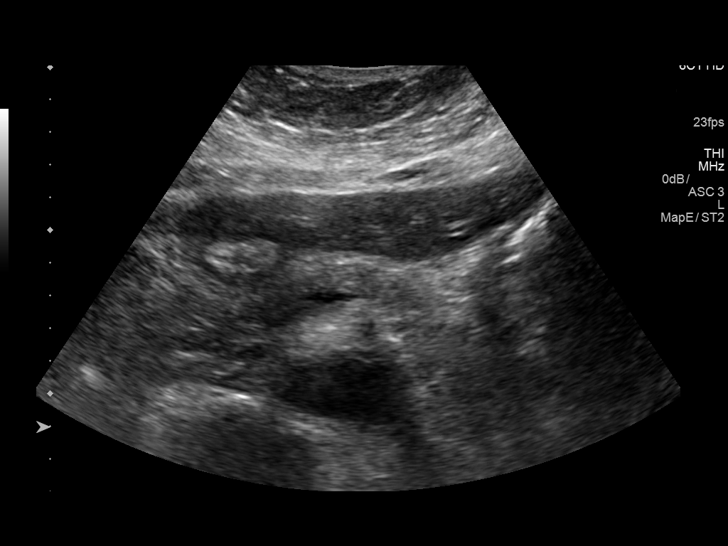
[im 39/77]
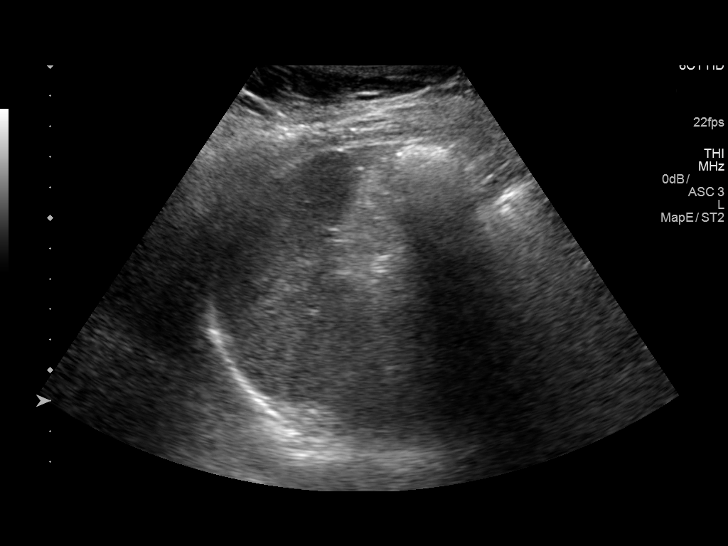
[im 45/77]
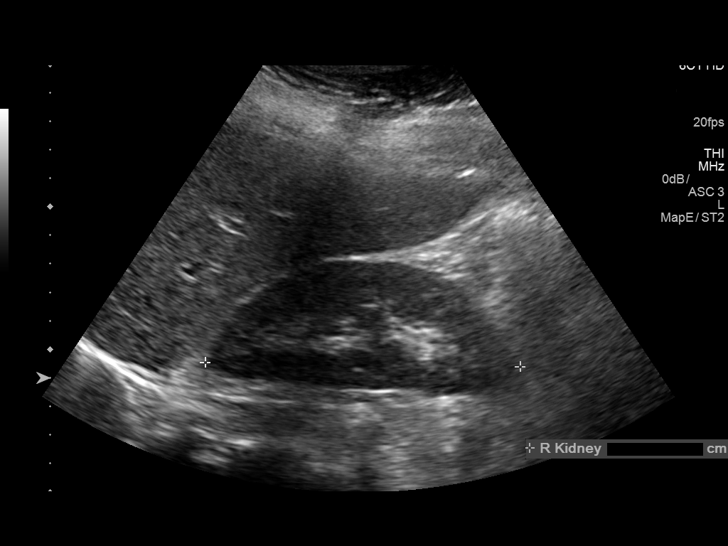
[im 51/77]
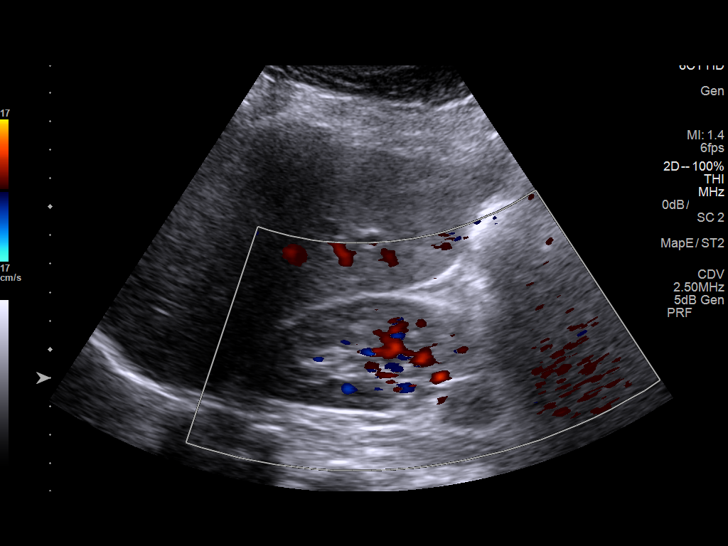
[im 58/77]
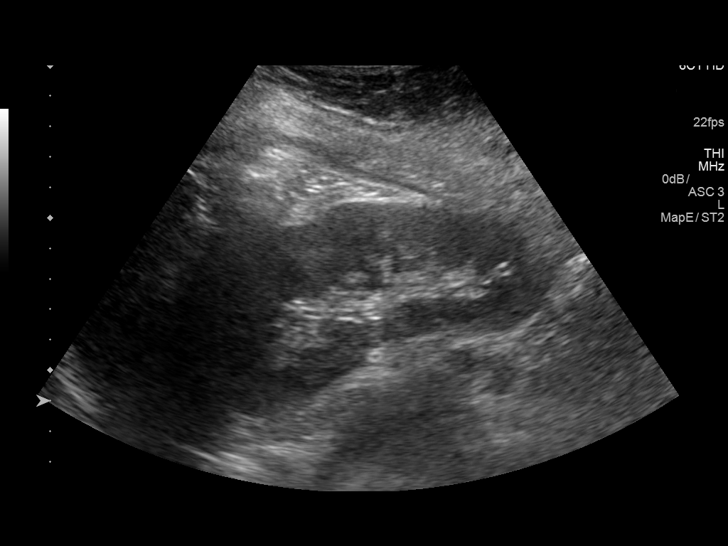
[im 64/77]
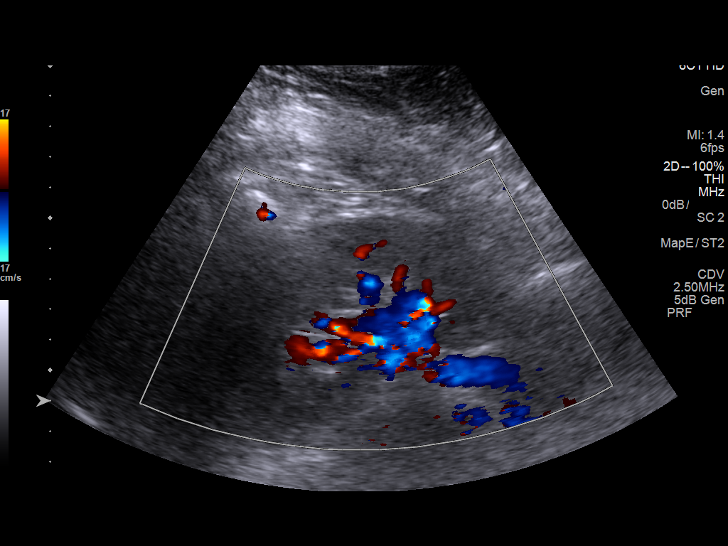
[im 70/77]
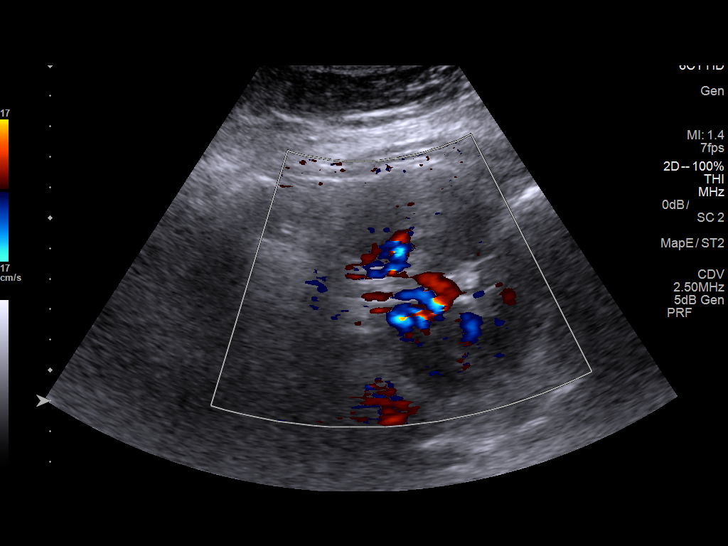
[im 77/77]
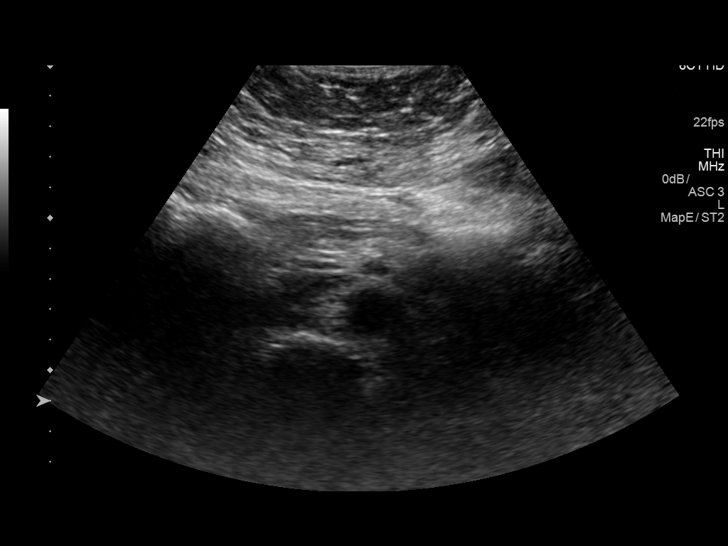

[13 of 25 positions shown; findings below may reference images not displayed]

FINDINGS: Gallbladder: The gallbladder is surgically absent.

Common bile duct: Diameter: 3 mm

Liver: The hepatic echotexture is mildly increased diffusely. The
surface contour is smooth. There is no focal mass or ductal
dilation. Portal vein is patent on color Doppler imaging with normal
direction of blood flow towards the liver.

IVC: No abnormality visualized.

Pancreas: Visualized portion unremarkable.

Spleen: Size and appearance within normal limits.

Right Kidney: Length: 11.04 cm. Echogenicity within normal limits.
No mass or hydronephrosis visualized.

Left Kidney: Length: 11.35 cm. The cortical echotexture is similar
to that on the right. There is a focal hyperechoic nonshadowing
focus in the midpole measuring 1.3 x 0.7 x 1 cm. There is no
hydronephrosis.

Abdominal aorta: No aneurysm visualized.

Other findings: There is no ascites.
IMPRESSION: Previous cholecystectomy. Normal appearing common bile duct.
Increased hepatic echotexture most compatible with fatty
infiltrative change.

No acute abnormality observed elsewhere within the abdomen. Probable
angiomyolipoma within the left kidney.
